# Patient Record
Sex: Female | Born: 2000 | Race: White | Hispanic: No | Marital: Single | Smoking: Never smoker
Health system: Southern US, Community
[De-identification: ages and names within clinical notes are randomized; demographics above are authoritative.]

## PROBLEM LIST (undated history)

## (undated) DIAGNOSIS — E282 Polycystic ovarian syndrome: Secondary | ICD-10-CM

## (undated) DIAGNOSIS — F419 Anxiety disorder, unspecified: Secondary | ICD-10-CM

## (undated) DIAGNOSIS — L732 Hidradenitis suppurativa: Secondary | ICD-10-CM

## (undated) HISTORY — DX: Anxiety disorder, unspecified: F41.9

## (undated) HISTORY — PX: FRACTURE SURGERY: SHX138

## (undated) HISTORY — DX: Hidradenitis suppurativa: L73.2

## (undated) HISTORY — DX: Polycystic ovarian syndrome: E28.2

---
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
## 2022-05-31 ENCOUNTER — Ambulatory Visit
Admission: EM | Admit: 2022-05-31 | Discharge: 2022-05-31 | Disposition: A | Discharge status: Home / Self Care | Payer: Self-pay | Attending: Family Medicine | Admitting: Family Medicine

## 2022-05-31 DIAGNOSIS — M7661 Achilles tendinitis, right leg: Secondary | ICD-10-CM

## 2022-05-31 MED ORDER — IBUPROFEN 800 MG PO TABS: 800.0000 mg | ORAL_TABLET | Freq: Three times a day (TID) | ORAL | 0 refills | Status: DC | PRN

## 2022-05-31 NOTE — ED Provider Notes (Signed)
[facility]    CSN: [id number] Arrival date & time: 05/31/22  1113      History   Chief Complaint No chief complaint on file.   HPI Meagan Walker is a 21 y.o. female.   Patient presenting today with 1 day history of posterior right ankle pain.  States she went to plant her foot to get up yesterday and felt a popping sensation.  The same thing happened several more times throughout the day and after twice it became very painful and she is now having stiffness, decreased range of motion and pain in the area.  Denies complete loss of range of motion, inability to bear weight, numbness, tingling, weakness, swelling redness or bruising to the site.  Took 1 ibuprofen yesterday with no relief.    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 Authorizing Provider  ibuprofen (ADVIL) 800 MG tablet Take 1 tablet (800 mg total) by mouth every 8 (eight) hours as needed. 05/31/22  Yes Particia Nearing, PA-C  doxycycline (VIBRAMYCIN) 100 MG capsule Take 1 capsule (100 mg total) by mouth 2 (two) times daily. 11/03/21    Achille Rich, PA-C  mupirocin ointment (BACTROBAN) 2 % Apply topically 2 (two) times daily. Apply to the area twice daily. 11/03/21   Achille Rich, PA-C  norethindrone-ethinyl estradiol (LOESTRIN 1/20, 21,) 1-20 MG-MCG tablet Take 1 tablet by mouth daily. Patient not taking: Reported on 01/04/2021 08/31/20   Silvana Newness, MD    Family History Family History  Problem Relation Age of Onset   Thyroid disease Maternal Grandfather    Diabetes Maternal Uncle     Social History Social History   Tobacco Use   Smoking status: Never    Passive exposure: Yes   Smokeless tobacco: Never  Vaping Use   Vaping Use: Every day  Substance Use Topics   Alcohol use: No   Drug use: No     Allergies   Patient has no known allergies.   Review of Systems Review of Systems Per HPI  Physical Exam Triage Vital Signs ED Triage Vitals  Enc Vitals Group     BP 05/31/22 1154 (!) 151/89     Pulse Rate 05/31/22 1154 85     Resp 05/31/22 1154 20     Temp 05/31/22 1154 98.1 F (36.7 C)     Temp Source  05/31/22 1154 Oral     SpO2 05/31/22 1154 97 %     Weight --      Height --      Head Circumference --      Peak Flow --      Pain Score 05/31/22 1158 6     Pain Loc --      Pain Edu? --      Excl. in GC? --    No data found.  Updated Vital Signs BP (!) 151/89 (BP Location: Right Arm)   Pulse 85   Temp 98.1 F (36.7 C) (Oral)   Resp 20   LMP 10/18/2020 (Approximate) Comment: pt has irregular mp. States she is unsure but thinks about 10/2020 was LMP  SpO2 97%   Visual Acuity Right Eye Distance:   Left Eye Distance:   Bilateral Distance:    Right Eye Near:   Left Eye Near:    Bilateral Near:     Physical Exam Vitals and nursing note reviewed.  Constitutional:      Appearance: Normal appearance. She is not ill-appearing.  HENT:     Head: Atraumatic.  Eyes:     Extraocular Movements: Extraocular movements intact.     Conjunctiva/sclera: Conjunctivae normal.   Cardiovascular:     Rate and Rhythm: Normal rate and regular rhythm.     Heart sounds: Normal heart sounds.  Pulmonary:     Effort: Pulmonary effort is normal.     Breath sounds: Normal breath sounds.  Musculoskeletal:        General: Tenderness and signs of injury present. No swelling or deformity. Normal range of motion.     Cervical back: Normal range of motion and neck supple.     Comments: Tender to palpation throughout right Achilles, nontender in the ankle joint or heel bony structures.  Range of motion intact but painful  Skin:    General: Skin is warm and dry.     Findings: No bruising or erythema.  Neurological:     Mental Status: She is alert and oriented to person, place, and time.     Comments: Right lower extremity neurovascularly intact  Psychiatric:        Mood and Affect: Mood normal.        Thought Content: Thought content normal.        Judgment: Judgment normal.      UC Treatments / Results  Labs (all labs ordered are listed, but only abnormal results are displayed) Labs Reviewed - No data to display  EKG   Radiology No results found.  Procedures Procedures (including critical care time)  Medications Ordered in UC Medications - No data to display  Initial Impression / Assessment and Plan / UC Course  I have reviewed the triage vital signs and the nursing notes.  Pertinent labs & imaging results that were available during my care of the patient were reviewed by me and considered in my medical decision making (see chart for details).     X-ray imaging deferred with shared decision making today.  Suspect some Achilles tendinitis, treat with RICE protocol, ibuprofen, Tylenol, slow return to activity as tolerated Final Clinical Impressions(s) / UC Diagnoses   Final diagnoses:  Achilles tendinitis of right lower extremity   Discharge Instructions   None    ED Prescriptions     Medication Sig Dispense Auth. Provider   ibuprofen (ADVIL) 800  MG tablet Take 1 tablet (800 mg total) by mouth every 8 (eight) hours  as needed. 30 tablet Particia Nearing, [place]      PDMP not reviewed this encounter.   Particia Nearing, [place] 05/31/22 1342

## 2022-05-31 NOTE — ED Triage Notes (Signed)
Pt reports right ankle pain x 1 day. Was sititng down and when she went to move her ankle it "popped". Then it did it three more times. The third time it was painful. Took ibuprofen but no relief

## 2022-10-15 ENCOUNTER — Encounter: Payer: Self-pay | Admitting: Adult Health

## 2022-10-15 ENCOUNTER — Ambulatory Visit: Payer: Medicaid Other | Admitting: Adult Health

## 2022-10-15 VITALS — BP 128/80 | HR 85 | Ht 65.0 in | Wt 293.5 lb

## 2022-10-15 DIAGNOSIS — E282 Polycystic ovarian syndrome: Secondary | ICD-10-CM

## 2022-10-15 DIAGNOSIS — N911 Secondary amenorrhea: Secondary | ICD-10-CM

## 2022-10-15 DIAGNOSIS — Z113 Encounter for screening for infections with a predominantly sexual mode of transmission: Secondary | ICD-10-CM

## 2022-10-15 LAB — POCT URINE PREGNANCY: Preg Test, Ur: NEGATIVE

## 2022-10-15 MED ORDER — MEDROXYPROGESTERONE ACETATE 10 MG PO TABS: ORAL_TABLET | ORAL | 0 refills | Status: DC

## 2022-10-15 NOTE — Progress Notes (Signed)
  Subjective:     Patient ID: Meagan Walker, female   DOB: 2001-04-14, 22 y.o.   MRN: [id number]  HPI Meagan Walker is a 22 year old white female, single, G0P0, in to discuss ?PCO was told she had, has not had a period in a year. She had labs in 2022, all normal except Testosterone  was elevated at 58. She says she started about age 22 in 3rd grade and have always been irregular, no cramping but clots when has.  She has recently had labs with PCP.(To get me a copy).  PCP is Denton Meek FNP  Review of Systems No period in over a year Is not currently having sex Reviewed past medical,surgical, social and family history. Reviewed medications and allergies.     Objective:   Physical Exam BP 128/80 (BP Location: Left Arm, Patient Position: Sitting, Cuff Size: Large)   Pulse 85   Ht 5\' 5"  (1.651 m)   Wt 293 lb 8 oz (133.1 kg)   BMI 48.84 kg/m  UPT is negative. Skin warm and dry.  Lungs: clear to ausculation bilaterally. Cardiovascular: regular rate and rhythm.    Fall risk is low  Upstream - 10/15/22 1606       Pregnancy Intention Screening   Does the patient want to become pregnant in the next year? No    Does the patient's partner want to become pregnant in the next year? No    Would the patient like to discuss contraceptive options today? No      Contraception Wrap Up   Current Method Abstinence    End Method Abstinence             Assessment:     1. Screening examination for STD (sexually transmitted disease) Urine sent for GC/CHL  - GC/Chlamydia Probe Amp  2. Secondary amenorrhea No period in a year UPT negative Will rx provera 10 mg take 1 daily for 10 days to get withdrawal bleed, and discussed could try birth control pills or cycle every 3 months with provera  Meds ordered this encounter  Medications   medroxyPROGESTERone (PROVERA) 10 MG tablet    Sig: Take 1 daily for 10 days    Dispense:  10 tablet    Refill:  0    Order Specific Question:   Supervising  Provider    Answer:   Lazaro Arms [[id number]]    Will get [place] to assess ovaries  - US PELVIC COMPLETE WITH TRANSVAGINAL; Future - POCT urine pregnancy  3. PCOS (polycystic ovarian syndrome) Will get [place] to assess ovaries  - US PELVIC COMPLETE WITH TRANSVAGINAL; Future     Plan:     Follow up with me in 3 weeks to see of had bleeding after provera Pelvic US in office in about 5 weeks to assess ovaries

## 2022-10-18 LAB — GC/CHLAMYDIA PROBE AMP
Chlamydia trachomatis, NAA: NEGATIVE
Neisseria Gonorrhoeae by PCR: NEGATIVE

## 2022-11-06 ENCOUNTER — Ambulatory Visit: Payer: Medicaid Other | Admitting: Adult Health

## 2022-11-06 ENCOUNTER — Encounter: Payer: Self-pay | Admitting: Adult Health

## 2022-11-06 VITALS — BP 116/73 | HR 76 | Ht 65.0 in | Wt 293.5 lb

## 2022-11-06 DIAGNOSIS — L680 Hirsutism: Secondary | ICD-10-CM

## 2022-11-06 DIAGNOSIS — E282 Polycystic ovarian syndrome: Secondary | ICD-10-CM

## 2022-11-06 DIAGNOSIS — N911 Secondary amenorrhea: Secondary | ICD-10-CM

## 2022-11-06 NOTE — Progress Notes (Signed)
  Subjective:     Patient ID: Meagan Walker, female   DOB: 02-11-2001, 22 y.o.   MRN: [id number]  HPI Meagan Walker is a 22 year old white female, single, G0P0, back in follow up on taking provera and did have a period, it lasted a week. It had been over a year since she had had a period.  PCP is Denton Meek NP  Review of Systems Had period after provera Reviewed past medical,surgical, social and family history. Reviewed medications and allergies.     Objective:   Physical Exam BP 116/73 (BP Location: Left Arm, Patient Position: Sitting, Cuff Size: Large)   Pulse 76   Ht 5\' 5"  (1.651 m)   Wt 293 lb 8 oz (133.1 kg)   LMP 10/27/2022 (Approximate)   BMI 48.84 kg/m     Skin warm and dry. +facial hair. Lungs: clear to ausculation bilaterally. Cardiovascular: regular rate and rhythm.  Reviewed labs from PCP.  Fall risk is low  Upstream - 11/06/22 1555       Pregnancy Intention Screening   Does the patient want to become pregnant in the next year? No    Does the patient's partner want to become pregnant in the next year? No    Would the patient like to discuss contraceptive options today? No      Contraception Wrap Up   Current Method Abstinence    End Method Abstinence             Assessment:     1. Secondary amenorrhea Had period after provera Will probably cycle with provera every 3 months, but she may decide to try OC  2. PCOS (polycystic ovarian syndrome) Getting [place] 12/03/22 to assess ovaries  3. Hirsutism  4. Morbid obesity (HCC) Try to work more Try to lose 20 lbs Just cut out 3500 calories a week     Plan:     [place] 12/03/22 to assess ovaries

## 2022-11-27 ENCOUNTER — Ambulatory Visit: Payer: Medicaid Other | Admitting: Adult Health

## 2022-11-27 ENCOUNTER — Other Ambulatory Visit: Payer: Medicaid Other

## 2022-12-03 ENCOUNTER — Other Ambulatory Visit: Payer: Medicaid Other

## 2022-12-03 ENCOUNTER — Ambulatory Visit (INDEPENDENT_AMBULATORY_CARE_PROVIDER_SITE_OTHER): Payer: Medicaid Other

## 2022-12-03 DIAGNOSIS — E282 Polycystic ovarian syndrome: Secondary | ICD-10-CM | POA: Diagnosis not present

## 2022-12-03 DIAGNOSIS — N911 Secondary amenorrhea: Secondary | ICD-10-CM

## 2022-12-03 NOTE — Progress Notes (Signed)
PELVIC US TA/TV: homogeneous anteverted uterus,WNL,EEC 11 mm,bilateral enlarged ovaries with multiple peripheral follicles,ovaries appear mobile,no free fluid,no pain during ultrasound  Chaperone Kara Mead

## 2023-08-13 DIAGNOSIS — F32A Depression, unspecified: Secondary | ICD-10-CM | POA: Insufficient documentation

## 2023-08-13 DIAGNOSIS — Z8616 Personal history of COVID-19: Secondary | ICD-10-CM | POA: Diagnosis not present

## 2023-08-13 NOTE — ED Triage Notes (Signed)
 Pt states she had an argument with mother and was crying behind building and fell asleep. When family found her she had been outside since 45.  Pt denies any SI/HI ideations, but she does cut herself to soothe.

## 2023-08-14 ENCOUNTER — Other Ambulatory Visit: Payer: Self-pay

## 2023-08-14 ENCOUNTER — Encounter (HOSPITAL_COMMUNITY): Payer: Self-pay | Admitting: Emergency Medicine

## 2023-08-14 ENCOUNTER — Emergency Department (HOSPITAL_COMMUNITY)
Admission: EM | Admit: 2023-08-14 | Discharge: 2023-08-14 | Disposition: A | Discharge status: Home / Self Care | Attending: Student | Admitting: Student

## 2023-08-14 DIAGNOSIS — F32A Depression, unspecified: Secondary | ICD-10-CM

## 2023-08-14 NOTE — ED Provider Notes (Signed)
 [place] EMERGENCY DEPARTMENT AT [facility] Provider Note  CSN: [id number] Arrival date & time: 08/13/23 2350  Chief Complaint(s) V70.1  HPI Meagan Walker is a 23 y.o. female with PMH anxiety, hidradenitis, PCOS who presents emergency room for evaluation of depression.  History obtained from patient and patient's mother who states that they had an argument at home.  Patient left the house and they were unable to find her for multiple hours.  She was ultimately found behind the shed asleep.  She was reportedly drowsy and cold to the touch.  She was brought home and placed under the blankets.  Family states that their pastor told them to bring the patient to the emergency department to have her evaluated as she was outside in the cold for an extended period of time.  At no point did the patient endorse any suicidal ideation, homicidal ideation, auditory or visual hallucinations.  On further discussion, patient states that she does have severe depression and has intermittent passive suicidal thoughts 3-4 times a week but currently does not feel suicidal.  She does not have a plan to kill herself today.  She does have a history of cutting but there are no recent lacerations seen on exam today.   Past Medical History Past Medical History:  Diagnosis Date   Anxiety    Hidradenitis    Laboratory confirmed diagnosis of COVID-19 07/18/2020   PCOS (polycystic ovarian syndrome)    Patient Active Problem List   Diagnosis Date Noted   Screening examination for STD (sexually transmitted disease) 10/15/2022   PCOS (polycystic ovarian syndrome) 08/31/2020   Vitamin D deficiency 08/31/2020   Low HDL (under 40) 08/31/2020   Hirsutism 08/01/2020   Severe obesity due to excess calories without serious comorbidity with body mass index (BMI) greater than 99th percentile for age in pediatric patient (HCC) 07/19/2020   Secondary amenorrhea 07/19/2020   Insomnia 07/19/2020   Morbid obesity (HCC)  04/10/2019   Home Medication(s) Prior to Admission medications   Medication Sig Start Date End Date Taking? Authorizing Provider  cephALEXin (KEFLEX) 250 MG capsule Take by mouth. 10/18/22   [provider]  FLUoxetine (PROZAC) 20 MG capsule Take 20 mg by mouth daily. 10/18/22   [provider]                                                                                                                                    Past Surgical History Past Surgical History:  Procedure Laterality Date   FRACTURE SURGERY     Family History Family History  Problem Relation Age of Onset   Thyroid disease Maternal Grandfather    Diabetes Maternal Uncle     Social History Social History   Tobacco Use   Smoking status: Never    Passive exposure: Yes   Smokeless tobacco: Never  Vaping Use   Vaping status: Every Day  Substance Use Topics  Alcohol use: No   Drug use: No   Allergies Patient has no known allergies.  Review of Systems Review of Systems  Psychiatric/Behavioral:  Positive for dysphoric mood.     Physical Exam Vital Signs  I have reviewed the triage vital signs BP (!) 141/101   Pulse 64   Temp 97.7 F (36.5 C)   Resp 18   Ht 5\' 5"  (1.651 m)   Wt 133 kg   SpO2 100%   BMI 48.79 kg/m   Physical Exam Vitals and nursing note reviewed.  Constitutional:      General: She is not in acute distress.    Appearance: She is well-developed.  HENT:     Head: Normocephalic and atraumatic.  Eyes:     Conjunctiva/sclera: Conjunctivae normal.  Cardiovascular:     Rate and Rhythm: Normal rate and regular rhythm.     Heart sounds: No murmur heard. Pulmonary:     Effort: Pulmonary effort is normal. No respiratory distress.     Breath sounds: Normal breath sounds.  Abdominal:     Palpations: Abdomen is soft.     Tenderness: There is no abdominal tenderness.  Musculoskeletal:        General: No swelling.     Cervical back: Neck supple.  Skin:    General:  Skin is warm and dry.     Capillary Refill: Capillary refill takes less than 2 seconds.  Neurological:     Mental Status: She is alert.  Psychiatric:        Mood and Affect: Mood normal.     ED Results and Treatments Labs (all labs ordered are listed, but only abnormal results are displayed) Labs Reviewed - No data to display                                                                                                                        Radiology No results found.  Pertinent labs & imaging results that were available during my care of the patient were reviewed by me and considered in my medical decision making (see MDM for details).  Medications Ordered in ED Medications - No data to display                                                                                                                                   Procedures Procedures  (including critical care time)  Medical Decision  Making / ED Course   This patient presents to the ED for concern of depression, cold exposure, this involves an extensive number of treatment options, and is a complaint that carries with it a high risk of complications and morbidity.  The differential diagnosis includes depression, suicidal ideation, psychosis, behavioral disturbance, cold exposure  MDM: Patient seen emergency room for evaluation of cold exposure and depression.  Physical exam with multiple healed superficial lacerations over bilateral forearms but is otherwise unremarkable.  Patient is alert and oriented answering all questions appropriately.  She is currently not suicidal or homicidal and does not meet criteria for emergent psychiatric consultation in the ER today.  Does not meet IVC criteria.  However, given her history of intermittent passive suicidal ideation, she would benefit from outpatient psychiatric evaluation and I provided resources for the outpatient behavioral health urgent care in [place].  Family states  that they will call tomorrow to schedule an appointment.  There is no evidence of severe cold exposure or frostbite in the ER today.  At this time she does not meet inpatient criteria for admission and will be discharged with outpatient follow-up.   Additional history obtained: -Additional history obtained from multiple family members -External records from outside source obtained and reviewed including: Chart review including previous notes, labs, imaging, consultation notes   Medicines ordered and prescription drug management: No orders of the defined types were placed in this encounter.   -I have reviewed the patients home medicines and have made adjustments as needed  Critical interventions none   Social Determinants of Health:  Factors impacting patients care include: History of depression, does not have outpatient psychiatric provider   Reevaluation: After the interventions noted above, I reevaluated the patient and found that they have :stayed the same  Co morbidities that complicate the patient evaluation  Past Medical History:  Diagnosis Date   Anxiety    Hidradenitis    Laboratory confirmed diagnosis of COVID-19 07/18/2020   PCOS (polycystic ovarian syndrome)       Dispostion: I considered admission for this patient, but at this time she does not meet inpatient criteria for admission and will be discharged with outpatient follow-up     Final Clinical Impression(s) / ED Diagnoses Final diagnoses:  Depression, unspecified depression type     @PCDICTATION @    Glendora Score, MD 08/14/23 [phone]

## 2023-09-23 ENCOUNTER — Encounter (HOSPITAL_COMMUNITY): Payer: Self-pay

## 2023-09-23 ENCOUNTER — Emergency Department (HOSPITAL_COMMUNITY)

## 2023-09-23 ENCOUNTER — Other Ambulatory Visit: Payer: Self-pay

## 2023-09-23 ENCOUNTER — Emergency Department (HOSPITAL_COMMUNITY)
Admission: EM | Admit: 2023-09-23 | Discharge: 2023-09-24 | Disposition: A | Discharge status: Home / Self Care | Attending: Emergency Medicine | Admitting: Emergency Medicine

## 2023-09-23 DIAGNOSIS — N12 Tubulo-interstitial nephritis, not specified as acute or chronic: Secondary | ICD-10-CM | POA: Insufficient documentation

## 2023-09-23 DIAGNOSIS — R1011 Right upper quadrant pain: Secondary | ICD-10-CM | POA: Diagnosis present

## 2023-09-23 DIAGNOSIS — N3000 Acute cystitis without hematuria: Secondary | ICD-10-CM | POA: Diagnosis not present

## 2023-09-23 LAB — URINALYSIS, ROUTINE W REFLEX MICROSCOPIC
Bilirubin Urine: NEGATIVE
Glucose, UA: NEGATIVE mg/dL
Ketones, ur: NEGATIVE mg/dL
Nitrite: NEGATIVE
Protein, ur: 100 mg/dL — AB
RBC / HPF: >50 RBC/hpf (ref 0–5)
Specific Gravity, Urine: 1.015 (ref 1.005–1.030)
WBC, UA: >50 WBC/hpf (ref 0–5)
pH: 7 (ref 5.0–8.0)

## 2023-09-23 LAB — HEPATIC FUNCTION PANEL
ALT: 29 U/L (ref 0–44)
AST: 24 U/L (ref 15–41)
Albumin: 3.7 g/dL (ref 3.5–5.0)
Alkaline Phosphatase: 51 U/L (ref 38–126)
Bilirubin, Direct: 0.3 mg/dL — ABNORMAL HIGH (ref 0.0–0.2)
Indirect Bilirubin: 0.8 mg/dL (ref 0.3–0.9)
Total Bilirubin: 1.1 mg/dL (ref 0.0–1.2)
Total Protein: 7 g/dL (ref 6.5–8.1)

## 2023-09-23 LAB — PREGNANCY, URINE: Preg Test, Ur: NEGATIVE

## 2023-09-23 LAB — CBC
HCT: 37.4 % (ref 36.0–46.0)
Hemoglobin: 12 g/dL (ref 12.0–15.0)
MCH: 26.8 pg (ref 26.0–34.0)
MCHC: 32.1 g/dL (ref 30.0–36.0)
MCV: 83.5 fL (ref 80.0–100.0)
Platelets: 218 10*3/uL (ref 150–400)
RBC: 4.48 MIL/uL (ref 3.87–5.11)
RDW: 13.1 % (ref 11.5–15.5)
WBC: 8.5 10*3/uL (ref 4.0–10.5)
nRBC: 0 % (ref 0.0–0.2)

## 2023-09-23 LAB — BASIC METABOLIC PANEL WITH GFR
Anion gap: 10 (ref 5–15)
BUN: 5 mg/dL — ABNORMAL LOW (ref 6–20)
CO2: 24 mmol/L (ref 22–32)
Calcium: 9.2 mg/dL (ref 8.9–10.3)
Chloride: 102 mmol/L (ref 98–111)
Creatinine, Ser: 0.87 mg/dL (ref 0.44–1.00)
GFR, Estimated: >60 mL/min (ref >60)
Glucose, Bld: 121 mg/dL — ABNORMAL HIGH (ref 70–99)
Potassium: 3.6 mmol/L (ref 3.5–5.1)
Sodium: 136 mmol/L (ref 135–145)

## 2023-09-23 LAB — LIPASE, BLOOD: Lipase: 23 U/L (ref 11–51)

## 2023-09-23 MED ORDER — CEFTRIAXONE SODIUM 1 G IJ SOLR
1.0000 g | Freq: Once | INTRAMUSCULAR | Status: AC
  Administered 2023-09-23: 1 g via INTRAVENOUS
  Filled 2023-09-23: qty 10

## 2023-09-23 MED ORDER — KETOROLAC TROMETHAMINE 15 MG/ML IJ SOLN
15.0000 mg | Freq: Once | INTRAMUSCULAR | Status: AC
Start: 2023-09-23 — End: 2023-09-23
  Administered 2023-09-23: 15 mg via INTRAVENOUS
  Filled 2023-09-23: qty 1

## 2023-09-23 MED ORDER — IOHEXOL 300 MG/ML  SOLN
100.0000 mL | Freq: Once | INTRAMUSCULAR | Status: AC | PRN
  Administered 2023-09-23: 100 mL via INTRAVENOUS

## 2023-09-23 NOTE — ED Notes (Signed)
 Patient transported to CT

## 2023-09-23 NOTE — ED Triage Notes (Signed)
 Pt to ED with mom c/o right sided pain that started yesterday after riding back from [facility]. Pt says pain starts in lower right quadrant wrapping around to back.

## 2023-09-23 NOTE — ED Notes (Signed)
 Pt is talking loudly, laughing on the phone with no apparent signs of discomfort. Call bell in reach, stretcher in lowest position.

## 2023-09-23 NOTE — ED Provider Notes (Signed)
 [place] EMERGENCY DEPARTMENT AT [facility] Provider Note   CSN: [id number] Arrival date & time: 09/23/23  2050     History  Chief Complaint  Patient presents with   Flank Pain    Meagan Walker is a 23 y.o. female.  Patient is a 23 year old female with past medical history of PCOS presenting for complaints of abdominal pain.  Patient admits to right upper quadrant and right lower quadrant abdominal pain with radiation to the right flank.  She denies dysuria, increased frequency or urgency.  No abdominal surgical histories.  States the symptoms started after 3-hour drive from out of town.  Also admits to low back pain and muscular pain.  Admits to nausea without vomiting.  Denies dehydration.  No improvement of pain after Tylenol 650 mg.  The history is provided by the patient. No language interpreter was used.  Flank Pain Associated symptoms include abdominal pain. Pertinent negatives include no chest pain and no shortness of breath.       Home Medications Prior to Admission medications   Medication Sig Start Date End Date Taking? Authorizing Provider  cephALEXin (KEFLEX) 250 MG capsule Take by mouth. 10/18/22   [provider]  FLUoxetine (PROZAC) 20 MG capsule Take 20 mg by mouth daily. 10/18/22   [provider]      Allergies    Patient has no known allergies.    Review of Systems   Review of Systems  Constitutional:  Negative for chills and fever.  HENT:  Negative for ear pain and sore throat.   Eyes:  Negative for pain and visual disturbance.  Respiratory:  Negative for cough and shortness of breath.   Cardiovascular:  Negative for chest pain and palpitations.  Gastrointestinal:  Positive for abdominal pain. Negative for vomiting.  Genitourinary:  Positive for flank pain. Negative for dysuria and hematuria.  Musculoskeletal:  Negative for arthralgias and back pain.  Skin:  Negative for color change and rash.  Neurological:  Negative  for seizures and syncope.  All other systems reviewed and are negative.   Physical Exam Updated Vital Signs BP 121/81   Pulse 100   Temp 99.8 F (37.7 C) (Oral)   Resp 19   Ht 5\' 5"  (1.651 m)   Wt 133 kg   SpO2 100%   BMI 48.79 kg/m  Physical Exam Vitals and nursing note reviewed.  Constitutional:      General: She is not in acute distress.    Appearance: She is well-developed.  HENT:     Head: Normocephalic and atraumatic.  Eyes:     Conjunctiva/sclera: Conjunctivae normal.  Cardiovascular:     Rate and Rhythm: Normal rate and regular rhythm.     Heart sounds: No murmur heard. Pulmonary:     Effort: Pulmonary effort is normal. No respiratory distress.     Breath sounds: Normal breath sounds.  Abdominal:     Palpations: Abdomen is soft.     Tenderness: There is abdominal tenderness in the right upper quadrant and right lower quadrant. There is guarding. There is no rebound. Positive signs include Murphy's sign.  Musculoskeletal:        General: No swelling.     Cervical back: Neck supple.  Skin:    General: Skin is warm and dry.     Capillary Refill: Capillary refill takes less than 2 seconds.  Neurological:     Mental Status: She is alert.  Psychiatric:        Mood  and Affect: Mood normal.     ED Results / Procedures / Treatments   Labs (all labs ordered are listed, but only abnormal results are displayed) Labs Reviewed  URINALYSIS, ROUTINE W REFLEX MICROSCOPIC - Abnormal; Notable for the following components:      Result Value   APPearance HAZY (*)    Hgb urine dipstick SMALL (*)    Protein, ur 100 (*)    Leukocytes,Ua MODERATE (*)    Bacteria, UA RARE (*)    All other components within normal limits  BASIC METABOLIC PANEL WITH GFR - Abnormal; Notable for the following components:   Glucose, Bld 121 (*)    BUN 5 (*)    All other components within normal limits  HEPATIC FUNCTION PANEL - Abnormal; Notable for the following components:   Bilirubin,  Direct 0.3 (*)    All other components within normal limits  CBC  PREGNANCY, URINE  LIPASE, BLOOD    EKG None  Radiology No results found.  Procedures Procedures    Medications Ordered in ED Medications  cefTRIAXone (ROCEPHIN) 1 g in sodium chloride 0.9 % 100 mL IVPB (1 g Intravenous New Bag/Given 09/23/23 2318)  ketorolac (TORADOL) 15 MG/ML injection 15 mg (15 mg Intravenous Given 09/23/23 2231)  iohexol (OMNIPAQUE) 300 MG/ML solution 100 mL (100 mLs Intravenous Contrast Given 09/23/23 2330)    ED Course/ Medical Decision Making/ A&P                                 Medical Decision Making Amount and/or Complexity of Data Reviewed Labs: ordered. Radiology: ordered.  Risk Prescription drug management.   59:71 PM 23 year old female with past medical history of PCOS presenting for complaints of abdominal pain.  Patient is alert and oriented x 3, no acute distress, afebrile, stable vital signs.  Physical exam demonstrates tenderness to palpation in the right upper quadrant, right lower quadrant, and right flank.  Patient unable to determine which pain is the worst.  She is guarding.  No distention or rebound.  Positive Lloyd sign.  Positive Murphy sign.  Differential diagnosis includes but is not limited to gallbladder pathology, pyelonephritis, ovarian pathology, appendicitis, ectopic pregnancy, etc.  Toradol given for pain.  Laboratory results demonstrate no leukocytosis.  No signs or symptoms of sepsis.  Stable liver profile, lipase, and renal function.  Positive for urinary tract infection.  Rocephin given.  Pyelonephritis more likely.  Urine pregnancy negative-No ectopic.  Patient signed out to oncoming provider while awaiting CT results.        Final Clinical Impression(s) / ED Diagnoses Final diagnoses:  Acute cystitis without hematuria  Pyelonephritis    Rx / DC Orders ED Discharge Orders     None         Quinn Bucco, DO 09/23/23 2334

## 2023-09-24 MED ORDER — CEPHALEXIN 500 MG PO CAPS: 500.0000 mg | ORAL_CAPSULE | Freq: Three times a day (TID) | ORAL | 0 refills | Status: AC

## 2023-09-24 NOTE — Discharge Instructions (Signed)
 Begin taking Keflex as prescribed.  Drink plenty of fluids.  Take ibuprofen 600 mg every 6 hours as needed for pain.  Return to the ER if symptoms significantly worsen or change.

## 2023-09-24 NOTE — ED Provider Notes (Signed)
  Physical Exam  BP 121/81   Pulse 100   Temp 99.8 F (37.7 C) (Oral)   Resp 19   Ht 5\' 5"  (1.651 m)   Wt 133 kg   SpO2 100%   BMI 48.79 kg/m   Physical Exam Vitals and nursing note reviewed.  Constitutional:      Appearance: Normal appearance.  Pulmonary:     Effort: Pulmonary effort is normal.  Skin:    General: Skin is warm and dry.  Neurological:     Mental Status: She is alert and oriented to person, place, and time.     Procedures  Procedures  ED Course / MDM    Medical Decision Making Amount and/or Complexity of Data Reviewed Labs: ordered. Radiology: ordered.  Risk Prescription drug management.   Care assumed from Dr. Martina Sledge at shift change.  Patient awaiting results of a CT scan of the abdomen and pelvis.  She presents here with right sided abdominal pain that appears to be related to a urinary tract infection.  Her CAT scan is negative for appendicitis, cholecystitis, or other acute issue.  She will be discharged with Keflex and as needed return.       Meagan Blander, MD 09/24/23 [phone]
